# Patient Record
Sex: Female | Born: 1970 | Race: Black or African American | Hispanic: No | Marital: Married | Smoking: Never smoker
Health system: Southern US, Community
[De-identification: ages and names within clinical notes are randomized; demographics above are authoritative.]

## PROBLEM LIST (undated history)

## (undated) DIAGNOSIS — L709 Acne, unspecified: Secondary | ICD-10-CM

## (undated) DIAGNOSIS — IMO0002 Reserved for concepts with insufficient information to code with codable children: Secondary | ICD-10-CM

## (undated) DIAGNOSIS — F419 Anxiety disorder, unspecified: Secondary | ICD-10-CM

## (undated) DIAGNOSIS — J45909 Unspecified asthma, uncomplicated: Secondary | ICD-10-CM

## (undated) DIAGNOSIS — K449 Diaphragmatic hernia without obstruction or gangrene: Secondary | ICD-10-CM

## (undated) HISTORY — DX: Unspecified asthma, uncomplicated: J45.909

## (undated) HISTORY — DX: Anxiety disorder, unspecified: F41.9

## (undated) HISTORY — DX: Reserved for concepts with insufficient information to code with codable children: IMO0002

## (undated) HISTORY — PX: UMBILICAL HERNIA REPAIR: SHX196

## (undated) HISTORY — DX: Acne, unspecified: L70.9

## (undated) HISTORY — PX: WISDOM TOOTH EXTRACTION: SHX21

## (undated) HISTORY — PX: CHOLECYSTECTOMY: SHX55

## (undated) HISTORY — DX: Diaphragmatic hernia without obstruction or gangrene: K44.9

---

## 2013-02-20 ENCOUNTER — Ambulatory Visit (INDEPENDENT_AMBULATORY_CARE_PROVIDER_SITE_OTHER): Payer: Managed Care, Other (non HMO) | Admitting: Obstetrics & Gynecology

## 2013-02-20 ENCOUNTER — Encounter: Payer: Self-pay | Admitting: Obstetrics & Gynecology

## 2013-02-20 VITALS — BP 135/91 | HR 80 | Resp 16 | Ht 66.0 in | Wt 188.0 lb

## 2013-02-20 DIAGNOSIS — Z124 Encounter for screening for malignant neoplasm of cervix: Secondary | ICD-10-CM

## 2013-02-20 DIAGNOSIS — Z113 Encounter for screening for infections with a predominantly sexual mode of transmission: Secondary | ICD-10-CM

## 2013-02-20 DIAGNOSIS — Z23 Encounter for immunization: Secondary | ICD-10-CM

## 2013-02-20 DIAGNOSIS — Z1151 Encounter for screening for human papillomavirus (HPV): Secondary | ICD-10-CM

## 2013-02-20 DIAGNOSIS — Z01419 Encounter for gynecological examination (general) (routine) without abnormal findings: Secondary | ICD-10-CM

## 2013-02-20 MED ORDER — INFLUENZA VAC SPLIT QUAD 0.5 ML IM SUSP: 0.5000 mL | Freq: Once | INTRAMUSCULAR | Status: AC

## 2013-02-20 NOTE — Patient Instructions (Signed)
Preventive Care for Adults, Female A healthy lifestyle and preventive care can promote health and wellness. Preventive health guidelines for women include the following key practices.  A routine yearly physical is a good way to check with your health care provider about your health and preventive screening. It is a chance to share any concerns and updates on your health and to receive a thorough exam.  Visit your dentist for a routine exam and preventive care every 6 months. Brush your teeth twice a day and floss once a day. Good oral hygiene prevents tooth decay and gum disease.  The frequency of eye exams is based on your age, health, family medical history, use of contact lenses, and other factors. Follow your health care provider's recommendations for frequency of eye exams.  Eat a healthy diet. Foods like vegetables, fruits, whole grains, low-fat dairy products, and lean protein foods contain the nutrients you need without too many calories. Decrease your intake of foods high in solid fats, added sugars, and salt. Eat the right amount of calories for you.Get information about a proper diet from your health care provider, if necessary.  Regular physical exercise is one of the most important things you can do for your health. Most adults should get at least 150 minutes of moderate-intensity exercise (any activity that increases your heart rate and causes you to sweat) each week. In addition, most adults need muscle-strengthening exercises on 2 or more days a week.  Maintain a healthy weight. The body mass index (BMI) is a screening tool to identify possible weight problems. It provides an estimate of body fat based on height and weight. Your health care provider can find your BMI, and can help you achieve or maintain a healthy weight.For adults 20 years and older:  A BMI below 18.5 is considered underweight.  A BMI of 18.5 to 24.9 is normal.  A BMI of 25 to 29.9 is considered  overweight.  A BMI of 30 and above is considered obese.  Maintain normal blood lipids and cholesterol levels by exercising and minimizing your intake of saturated fat. Eat a balanced diet with plenty of fruit and vegetables. Blood tests for lipids and cholesterol should begin at age 20 and be repeated every 5 years. If your lipid or cholesterol levels are high, you are over 50, or you are at high risk for heart disease, you may need your cholesterol levels checked more frequently.Ongoing high lipid and cholesterol levels should be treated with medicines if diet and exercise are not working.  If you smoke, find out from your health care provider how to quit. If you do not use tobacco, do not start.  Lung cancer screening is recommended for adults aged 55 80 years who are at high risk for developing lung cancer because of a history of smoking. A yearly low-dose CT scan of the lungs is recommended for people who have at least a 30-pack-year history of smoking and are a current smoker or have quit within the past 15 years. A pack year of smoking is smoking an average of 1 pack of cigarettes a day for 1 year (for example: 1 pack a day for 30 years or 2 packs a day for 15 years). Yearly screening should continue until the smoker has stopped smoking for at least 15 years. Yearly screening should be stopped for people who develop a health problem that would prevent them from having lung cancer treatment.  If you are pregnant, do not drink alcohol. If you   are breastfeeding, be very cautious about drinking alcohol. If you are not pregnant and choose to drink alcohol, do not have more than 1 drink per day. One drink is considered to be 12 ounces (355 mL) of beer, 5 ounces (148 mL) of wine, or 1.5 ounces (44 mL) of liquor.  Avoid use of street drugs. Do not share needles with anyone. Ask for help if you need support or instructions about stopping the use of drugs.  High blood pressure causes heart disease and  increases the risk of stroke. Your blood pressure should be checked at least every 1 to 2 years. Ongoing high blood pressure should be treated with medicines if weight loss and exercise do not work.  If you are 20 43 years old, ask your health care provider if you should take aspirin to prevent strokes.  Diabetes screening involves taking a blood sample to check your fasting blood sugar level. This should be done once every 3 years, after age 35, if you are within normal weight and without risk factors for diabetes. Testing should be considered at a younger age or be carried out more frequently if you are overweight and have at least 1 risk factor for diabetes.  Breast cancer screening is essential preventive care for women. You should practice "breast self-awareness." This means understanding the normal appearance and feel of your breasts and may include breast self-examination. Any changes detected, no matter how small, should be reported to a health care provider. Women in their 42s and 30s should have a clinical breast exam (CBE) by a health care provider as part of a regular health exam every 1 to 3 years. After age 74, women should have a CBE every year. Starting at age 43, women should consider having a mammogram (breast X-ray test) every year. Women who have a family history of breast cancer should talk to their health care provider about genetic screening. Women at a high risk of breast cancer should talk to their health care providers about having an MRI and a mammogram every year.  Breast cancer gene (BRCA)-related cancer risk assessment is recommended for women who have family members with BRCA-related cancers. BRCA-related cancers include breast, ovarian, tubal, and peritoneal cancers. Having family members with these cancers may be associated with an increased risk for harmful changes (mutations) in the breast cancer genes BRCA1 and BRCA2. Results of the assessment will determine the need for  genetic counseling and BRCA1 and BRCA2 testing.  The Pap test is a screening test for cervical cancer. A Pap test can show cell changes on the cervix that might become cervical cancer if left untreated. A Pap test is a procedure in which cells are obtained and examined from the lower end of the uterus (cervix).  Women should have a Pap test starting at age 60.  Between ages 63 and 62, Pap tests should be repeated every 2 years.  Beginning at age 43, you should have a Pap test every 3 years as long as the past 3 Pap tests have been normal.  Some women have medical problems that increase the chance of getting cervical cancer. Talk to your health care provider about these problems. It is especially important to talk to your health care provider if a new problem develops soon after your last Pap test. In these cases, your health care provider may recommend more frequent screening and Pap tests.  The above recommendations are the same for women who have or have not gotten the vaccine  for human papillomavirus (HPV).  If you had a hysterectomy for a problem that was not cancer or a condition that could lead to cancer, then you no longer need Pap tests. Even if you no longer need a Pap test, a regular exam is a good idea to make sure no other problems are starting.  If you are between ages 65 and 70 years, and you have had normal Pap tests going back 10 years, you no longer need Pap tests. Even if you no longer need a Pap test, a regular exam is a good idea to make sure no other problems are starting.  If you have had past treatment for cervical cancer or a condition that could lead to cancer, you need Pap tests and screening for cancer for at least 20 years after your treatment.  If Pap tests have been discontinued, risk factors (such as a new sexual partner) need to be reassessed to determine if screening should be resumed.  The HPV test is an additional test that may be used for cervical cancer  screening. The HPV test looks for the virus that can cause the cell changes on the cervix. The cells collected during the Pap test can be tested for HPV. The HPV test could be used to screen women aged 30 years and older, and should be used in women of any age who have unclear Pap test results. After the age of 30, women should have HPV testing at the same frequency as a Pap test.  Colorectal cancer can be detected and often prevented. Most routine colorectal cancer screening begins at the age of 50 years and continues through age 75 years. However, your health care provider may recommend screening at an earlier age if you have risk factors for colon cancer. On a yearly basis, your health care provider may provide home test kits to check for hidden blood in the stool. Use of a small camera at the end of a tube, to directly examine the colon (sigmoidoscopy or colonoscopy), can detect the earliest forms of colorectal cancer. Talk to your health care provider about this at age 50, when routine screening begins. Direct exam of the colon should be repeated every 5 10 years through age 75 years, unless early forms of pre-cancerous polyps or small growths are found.  People who are at an increased risk for hepatitis B should be screened for this virus. You are considered at high risk for hepatitis B if:  You were born in a country where hepatitis B occurs often. Talk with your health care provider about which countries are considered high risk.  Your parents were born in a high-risk country and you have not received a shot to protect against hepatitis B (hepatitis B vaccine).  You have HIV or AIDS.  You use needles to inject street drugs.  You live with, or have sex with, someone who has Hepatitis B.  You get hemodialysis treatment.  You take certain medicines for conditions like cancer, organ transplantation, and autoimmune conditions.  Hepatitis C blood testing is recommended for all people born from  1945 through 1965 and any individual with known risks for hepatitis C.  Practice safe sex. Use condoms and avoid high-risk sexual practices to reduce the spread of sexually transmitted infections (STIs). STIs include gonorrhea, chlamydia, syphilis, trichomonas, herpes, HPV, and human immunodeficiency virus (HIV). Herpes, HIV, and HPV are viral illnesses that have no cure. They can result in disability, cancer, and death. Sexually active women aged 25   years and younger should be checked for chlamydia. Older women with new or multiple partners should also be tested for chlamydia. Testing for other STIs is recommended if you are sexually active and at increased risk.  Osteoporosis is a disease in which the bones lose minerals and strength with aging. This can result in serious bone fractures or breaks. The risk of osteoporosis can be identified using a bone density scan. Women ages 65 years and over and women at risk for fractures or osteoporosis should discuss screening with their health care providers. Ask your health care provider whether you should take a calcium supplement or vitamin D to reduce the rate of osteoporosis.  Menopause can be associated with physical symptoms and risks. Hormone replacement therapy is available to decrease symptoms and risks. You should talk to your health care provider about whether hormone replacement therapy is right for you.  Use sunscreen. Apply sunscreen liberally and repeatedly throughout the day. You should seek shade when your shadow is shorter than you. Protect yourself by wearing long sleeves, pants, a wide-brimmed hat, and sunglasses year round, whenever you are outdoors.  Once a month, do a whole body skin exam, using a mirror to look at the skin on your back. Tell your health care provider of new moles, moles that have irregular borders, moles that are larger than a pencil eraser, or moles that have changed in shape or color.  Stay current with required  vaccines (immunizations).  Influenza vaccine. All adults should be immunized every year.  Tetanus, diphtheria, and acellular pertussis (Td, Tdap) vaccine. Pregnant women should receive 1 dose of Tdap vaccine during each pregnancy. The dose should be obtained regardless of the length of time since the last dose. Immunization is preferred during the 27th 36th week of gestation. An adult who has not previously received Tdap or who does not know her vaccine status should receive 1 dose of Tdap. This initial dose should be followed by tetanus and diphtheria toxoids (Td) booster doses every 10 years. Adults with an unknown or incomplete history of completing a 3-dose immunization series with Td-containing vaccines should begin or complete a primary immunization series including a Tdap dose. Adults should receive a Td booster every 10 years.  Varicella vaccine. An adult without evidence of immunity to varicella should receive 2 doses or a second dose if she has previously received 1 dose. Pregnant females who do not have evidence of immunity should receive the first dose after pregnancy. This first dose should be obtained before leaving the health care facility. The second dose should be obtained 4 8 weeks after the first dose.  Human papillomavirus (HPV) vaccine. Females aged 13 26 years who have not received the vaccine previously should obtain the 3-dose series. The vaccine is not recommended for use in pregnant females. However, pregnancy testing is not needed before receiving a dose. If a female is found to be pregnant after receiving a dose, no treatment is needed. In that case, the remaining doses should be delayed until after the pregnancy. Immunization is recommended for any person with an immunocompromised condition through the age of 26 years if she did not get any or all doses earlier. During the 3-dose series, the second dose should be obtained 4 8 weeks after the first dose. The third dose should be  obtained 24 weeks after the first dose and 16 weeks after the second dose.  Zoster vaccine. One dose is recommended for adults aged 60 years or older unless certain   conditions are present.  Measles, mumps, and rubella (MMR) vaccine. Adults born before 1957 generally are considered immune to measles and mumps. Adults born in 1957 or later should have 1 or more doses of MMR vaccine unless there is a contraindication to the vaccine or there is laboratory evidence of immunity to each of the three diseases. A routine second dose of MMR vaccine should be obtained at least 28 days after the first dose for students attending postsecondary schools, health care workers, or international travelers. People who received inactivated measles vaccine or an unknown type of measles vaccine during 1963 1967 should receive 2 doses of MMR vaccine. People who received inactivated mumps vaccine or an unknown type of mumps vaccine before 1979 and are at high risk for mumps infection should consider immunization with 2 doses of MMR vaccine. For females of childbearing age, rubella immunity should be determined. If there is no evidence of immunity, females who are not pregnant should be vaccinated. If there is no evidence of immunity, females who are pregnant should delay immunization until after pregnancy. Unvaccinated health care workers born before 1957 who lack laboratory evidence of measles, mumps, or rubella immunity or laboratory confirmation of disease should consider measles and mumps immunization with 2 doses of MMR vaccine or rubella immunization with 1 dose of MMR vaccine.  Pneumococcal 13-valent conjugate (PCV13) vaccine. When indicated, a person who is uncertain of her immunization history and has no record of immunization should receive the PCV13 vaccine. An adult aged 19 years or older who has certain medical conditions and has not been previously immunized should receive 1 dose of PCV13 vaccine. This PCV13 should be  followed with a dose of pneumococcal polysaccharide (PPSV23) vaccine. The PPSV23 vaccine dose should be obtained at least 8 weeks after the dose of PCV13 vaccine. An adult aged 19 years or older who has certain medical conditions and previously received 1 or more doses of PPSV23 vaccine should receive 1 dose of PCV13. The PCV13 vaccine dose should be obtained 1 or more years after the last PPSV23 vaccine dose.  Pneumococcal polysaccharide (PPSV23) vaccine. When PCV13 is also indicated, PCV13 should be obtained first. All adults aged 65 years and older should be immunized. An adult younger than age 65 years who has certain medical conditions should be immunized. Any person who resides in a nursing home or long-term care facility should be immunized. An adult smoker should be immunized. People with an immunocompromised condition and certain other conditions should receive both PCV13 and PPSV23 vaccines. People with human immunodeficiency virus (HIV) infection should be immunized as soon as possible after diagnosis. Immunization during chemotherapy or radiation therapy should be avoided. Routine use of PPSV23 vaccine is not recommended for American Indians, [place] Natives, or people younger than 65 years unless there are medical conditions that require PPSV23 vaccine. When indicated, people who have unknown immunization and have no record of immunization should receive PPSV23 vaccine. One-time revaccination 5 years after the first dose of PPSV23 is recommended for people aged 19 64 years who have chronic kidney failure, nephrotic syndrome, asplenia, or immunocompromised conditions. People who received 1 2 doses of PPSV23 before age 65 years should receive another dose of PPSV23 vaccine at age 65 years or later if at least 5 years have passed since the previous dose. Doses of PPSV23 are not needed for people immunized with PPSV23 at or after age 65 years.  Meningococcal vaccine. Adults with asplenia or persistent  complement component deficiencies should receive 2   doses of quadrivalent meningococcal conjugate (MenACWY-D) vaccine. The doses should be obtained at least 2 months apart. Microbiologists working with certain meningococcal bacteria, military recruits, people at risk during an outbreak, and people who travel to or live in countries with a high rate of meningitis should be immunized. A first-year college student up through age 21 years who is living in a residence hall should receive a dose if she did not receive a dose on or after her 16th birthday. Adults who have certain high-risk conditions should receive one or more doses of vaccine.  Hepatitis A vaccine. Adults who wish to be protected from this disease, have certain high-risk conditions, work with hepatitis A-infected animals, work in hepatitis A research labs, or travel to or work in countries with a high rate of hepatitis A should be immunized. Adults who were previously unvaccinated and who anticipate close contact with an international adoptee during the first 60 days after arrival in the [place] from a country with a high rate of hepatitis A should be immunized.  Hepatitis B vaccine. Adults who wish to be protected from this disease, have certain high-risk conditions, may be exposed to blood or other infectious body fluids, are household contacts or sex partners of hepatitis B positive people, are clients or workers in certain care facilities, or travel to or work in countries with a high rate of hepatitis B should be immunized.  Haemophilus influenzae type b (Hib) vaccine. A previously unvaccinated person with asplenia or sickle cell disease or having a scheduled splenectomy should receive 1 dose of Hib vaccine. Regardless of previous immunization, a recipient of a hematopoietic stem cell transplant should receive a 3-dose series 6 12 months after her successful transplant. Hib vaccine is not recommended for adults with HIV  infection. Preventive Services / Frequency Ages 19 to 39years  Blood pressure check.** / Every 1 to 2 years.  Lipid and cholesterol check.** / Every 5 years beginning at age 20.  Clinical breast exam.** / Every 3 years for women in their 20s and 30s.  BRCA-related cancer risk assessment.** / For women who have family members with a BRCA-related cancer (breast, ovarian, tubal, or peritoneal cancers).  Pap test.** / Every 2 years from ages 21 through 29. Every 3 years starting at age 30 through age 65 or 70 with a history of 3 consecutive normal Pap tests.  HPV screening.** / Every 3 years from ages 30 through ages 65 to 70 with a history of 3 consecutive normal Pap tests.  Hepatitis C blood test.** / For any individual with known risks for hepatitis C.  Skin self-exam. / Monthly.  Influenza vaccine. / Every year.  Tetanus, diphtheria, and acellular pertussis (Tdap, Td) vaccine.** / Consult your health care provider. Pregnant women should receive 1 dose of Tdap vaccine during each pregnancy. 1 dose of Td every 10 years.  Varicella vaccine.** / Consult your health care provider. Pregnant females who do not have evidence of immunity should receive the first dose after pregnancy.  HPV vaccine. / 3 doses over 6 months, if 26 and younger. The vaccine is not recommended for use in pregnant females. However, pregnancy testing is not needed before receiving a dose.  Measles, mumps, rubella (MMR) vaccine.** / You need at least 1 dose of MMR if you were born in 1957 or later. You may also need a 2nd dose. For females of childbearing age, rubella immunity should be determined. If there is no evidence of immunity, females who are not   pregnant should be vaccinated. If there is no evidence of immunity, females who are pregnant should delay immunization until after pregnancy.  Pneumococcal 13-valent conjugate (PCV13) vaccine.** / Consult your health care provider.  Pneumococcal polysaccharide (PPSV23)  vaccine.** / 1 to 2 doses if you smoke cigarettes or if you have certain conditions.  Meningococcal vaccine.** / 1 dose if you are age 88 to 41 years and a Market researcher living in a residence hall, or have one of several medical conditions, you need to get vaccinated against meningococcal disease. You may also need additional booster doses.  Hepatitis A vaccine.** / Consult your health care provider.  Hepatitis B vaccine.** / Consult your health care provider.  Haemophilus influenzae type b (Hib) vaccine.** / Consult your health care provider. Ages 45 to 64years  Blood pressure check.** / Every 1 to 2 years.  Lipid and cholesterol check.** / Every 5 years beginning at age 2 years.  Lung cancer screening. / Every year if you are aged 10 80 years and have a 30-pack-year history of smoking and currently smoke or have quit within the past 15 years. Yearly screening is stopped once you have quit smoking for at least 15 years or develop a health problem that would prevent you from having lung cancer treatment.  Clinical breast exam.** / Every year after age 28 years.  BRCA-related cancer risk assessment.** / For women who have family members with a BRCA-related cancer (breast, ovarian, tubal, or peritoneal cancers).  Mammogram.** / Every year beginning at age 63 years and continuing for as long as you are in good health. Consult with your health care provider.  Pap test.** / Every 3 years starting at age 71 years through age 39 or 90 years with a history of 3 consecutive normal Pap tests.  HPV screening.** / Every 3 years from ages 27 years through ages 32 to 72 years with a history of 3 consecutive normal Pap tests.  Fecal occult blood test (FOBT) of stool. / Every year beginning at age 40 years and continuing until age 29 years. You may not need to do this test if you get a colonoscopy every 10 years.  Flexible sigmoidoscopy or colonoscopy.** / Every 5 years for a flexible  sigmoidoscopy or every 10 years for a colonoscopy beginning at age 66 years and continuing until age 47 years.  Hepatitis C blood test.** / For all people born from [birth year] through 1965 and any individual with known risks for hepatitis C.  Skin self-exam. / Monthly.  Influenza vaccine. / Every year.  Tetanus, diphtheria, and acellular pertussis (Tdap/Td) vaccine.** / Consult your health care provider. Pregnant women should receive 1 dose of Tdap vaccine during each pregnancy. 1 dose of Td every 10 years.  Varicella vaccine.** / Consult your health care provider. Pregnant females who do not have evidence of immunity should receive the first dose after pregnancy.  Zoster vaccine.** / 1 dose for adults aged 39 years or older.  Measles, mumps, rubella (MMR) vaccine.** / You need at least 1 dose of MMR if you were born in 1957 or later. You may also need a 2nd dose. For females of childbearing age, rubella immunity should be determined. If there is no evidence of immunity, females who are not pregnant should be vaccinated. If there is no evidence of immunity, females who are pregnant should delay immunization until after pregnancy.  Pneumococcal 13-valent conjugate (PCV13) vaccine.** / Consult your health care provider.  Pneumococcal polysaccharide (PPSV23) vaccine.** / 1  to 2 doses if you smoke cigarettes or if you have certain conditions.  Meningococcal vaccine.** / Consult your health care provider.  Hepatitis A vaccine.** / Consult your health care provider.  Hepatitis B vaccine.** / Consult your health care provider.  Haemophilus influenzae type b (Hib) vaccine.** / Consult your health care provider. Ages 66 years and over  Blood pressure check.** / Every 1 to 2 years.  Lipid and cholesterol check.** / Every 5 years beginning at age 50 years.  Lung cancer screening. / Every year if you are aged 65 80 years and have a 30-pack-year history of smoking and currently smoke or have quit  within the past 15 years. Yearly screening is stopped once you have quit smoking for at least 15 years or develop a health problem that would prevent you from having lung cancer treatment.  Clinical breast exam.** / Every year after age 71 years.  BRCA-related cancer risk assessment.** / For women who have family members with a BRCA-related cancer (breast, ovarian, tubal, or peritoneal cancers).  Mammogram.** / Every year beginning at age 58 years and continuing for as long as you are in good health. Consult with your health care provider.  Pap test.** / Every 3 years starting at age 51 years through age 34 or 38 years with 3 consecutive normal Pap tests. Testing can be stopped between 65 and 70 years with 3 consecutive normal Pap tests and no abnormal Pap or HPV tests in the past 10 years.  HPV screening.** / Every 3 years from ages 35 years through ages 84 or 80 years with a history of 3 consecutive normal Pap tests. Testing can be stopped between 65 and 70 years with 3 consecutive normal Pap tests and no abnormal Pap or HPV tests in the past 10 years.  Fecal occult blood test (FOBT) of stool. / Every year beginning at age 26 years and continuing until age 51 years. You may not need to do this test if you get a colonoscopy every 10 years.  Flexible sigmoidoscopy or colonoscopy.** / Every 5 years for a flexible sigmoidoscopy or every 10 years for a colonoscopy beginning at age 55 years and continuing until age 19 years.  Hepatitis C blood test.** / For all people born from [birth year] through 1965 and any individual with known risks for hepatitis C.  Osteoporosis screening.** / A one-time screening for women ages 68 years and over and women at risk for fractures or osteoporosis.  Skin self-exam. / Monthly.  Influenza vaccine. / Every year.  Tetanus, diphtheria, and acellular pertussis (Tdap/Td) vaccine.** / 1 dose of Td every 10 years.  Varicella vaccine.** / Consult your health care  provider.  Zoster vaccine.** / 1 dose for adults aged 21 years or older.  Pneumococcal 13-valent conjugate (PCV13) vaccine.** / Consult your health care provider.  Pneumococcal polysaccharide (PPSV23) vaccine.** / 1 dose for all adults aged 43 years and older.  Meningococcal vaccine.** / Consult your health care provider.  Hepatitis A vaccine.** / Consult your health care provider.  Hepatitis B vaccine.** / Consult your health care provider.  Haemophilus influenzae type b (Hib) vaccine.** / Consult your health care provider. ** Family history and personal history of risk and conditions may change your health care provider's recommendations. Document Released: 03/22/2001 Document Revised: 11/14/2012 Document Reviewed: 06/21/2010 [facility] Patient Information 2014 [place], [place].  Thank you for enrolling in [place]. Please follow the instructions below to securely access your online medical record. MyChart allows you to send messages  to your doctor, view your test results, manage appointments, and more.   How Do I Sign Up? 1. In your Internet browser, go to [employer] and enter [email or web address]. 2. Click on the Sign Up Now link in the Sign In box. You will see the New Member Sign Up page. 3. Enter your MyChart Access Code exactly as it appears below. You will not need to use this code after you've completed the sign-up process. If you do not sign up before the expiration date, you must request a new code.  MyChart Access Code: [email or web address] Expires: 04/21/2013  9:57 AM  4. Enter your Social Security Number ([id number]) and Date of Birth (mm/dd/yyyy) as indicated and click Submit. You will be taken to the next sign-up page. 5. Create a MyChart ID. This will be your MyChart login ID and cannot be changed, so think of one that is secure and easy to remember. 6. Create a MyChart password. You can change your password at any time. 7. Enter your Password Reset Question  and Answer. This can be used at a later time if you forget your password.  8. Enter your e-mail address. You will receive e-mail notification when new information is available in [place]. 9. Click Sign Up. You can now view your medical record.   Additional Information Remember, MyChart is NOT to be used for urgent needs. For medical emergencies, dial 911.

## 2013-02-20 NOTE — Progress Notes (Signed)
    Subjective:     Jaclyn Joseph is a 43 y.o. G73P1011 female and is here for a comprehensive gynecologic exam. The patient reports having recurrent BV episodes, no current symptoms.  Wants STI screen. No other acute concerns.  Needs mammogram scheduled.   History   Social History  . Marital Status: Married    Spouse Name: N/A    Number of Children: N/A  . Years of Education: N/A   Occupational History  . Nurse    Social History Main Topics  . Smoking status: Never Smoker   . Smokeless tobacco: Never Used  . Alcohol Use: Yes     Comment: very rarely  . Drug Use: No  . Sexual Activity: Yes    Partners: Male   Other Topics Concern  . Not on file   Social History Narrative  . No narrative on file   No health maintenance topics applied.  The following portions of the patient's history were reviewed and updated as appropriate: allergies, current medications, past family history, past medical history, past social history, past surgical history and problem list.  Review of Systems A comprehensive review of systems was negative.   Objective:     BP 135/91  Pulse 80  Resp 16  Ht 5\' 6"  (1.676 m)  Wt 188 lb (85.276 kg)  BMI 30.36 kg/m2  LMP 01/29/2013 GENERAL: Well-developed, well-nourished female in no acute distress.  HEENT: Normocephalic, atraumatic. Sclerae anicteric.  NECK: Supple. Normal thyroid.  LUNGS: Clear to auscultation bilaterally.  HEART: Regular rate and rhythm. BREASTS: Symmetric in size. No masses, skin changes, nipple drainage, or lymphadenopathy. ABDOMEN: Soft, nontender, nondistended. No organomegaly. PELVIC: Normal external female genitalia. Vagina is pink and rugated.  Scant white discharge. Normal cervix contour. Pap smear obtained. Uterus is retroverted, normal in size. No adnexal mass or tenderness.  EXTREMITIES: No cyanosis, clubbing, or edema, 2+ distal pulses.   Assessment:    Healthy female exam Desires STI screen/ancillary testing     Plan:   Pap done with ancillary testing, will follow up results and manage accordingly. If there is BV, will consider extended treatment  Mammogram scheduled STI screen done; will follow up results and manage accordingly. Routine preventative health maintenance measures emphasized   Verita Schneiders, MD, [place] Attending [place], [place]

## 2013-02-21 LAB — SYPHILIS: RPR W/REFLEX TO RPR TITER AND TREPONEMAL ANTIBODIES, TRADITIONAL SCREENING AND DIAGNOSIS ALGORITHM

## 2013-02-21 LAB — HEPATITIS C ANTIBODY: HCV Ab: NEGATIVE

## 2013-02-21 LAB — HIV ANTIBODY (ROUTINE TESTING W REFLEX): HIV: NONREACTIVE

## 2013-02-21 LAB — HSV(HERPES SMPLX)ABS-I+II(IGG+IGM)-BLD
HSV 1 Glycoprotein G Ab, IgG: 0.11 IV
HSV 2 Glycoprotein G Ab, IgG: 0.16 IV
Herpes Simplex Vrs I&II-IgM Ab (EIA): 1.05 INDEX

## 2013-02-21 LAB — HEPATITIS B SURFACE ANTIGEN: Hepatitis B Surface Ag: NEGATIVE

## 2013-02-26 ENCOUNTER — Ambulatory Visit (INDEPENDENT_AMBULATORY_CARE_PROVIDER_SITE_OTHER): Payer: Managed Care, Other (non HMO)

## 2013-02-26 DIAGNOSIS — Z01419 Encounter for gynecological examination (general) (routine) without abnormal findings: Secondary | ICD-10-CM

## 2013-02-26 DIAGNOSIS — Z1231 Encounter for screening mammogram for malignant neoplasm of breast: Secondary | ICD-10-CM

## 2013-02-28 NOTE — Progress Notes (Signed)
Notified patient of results 

## 2013-12-09 ENCOUNTER — Encounter: Payer: Self-pay | Admitting: Obstetrics & Gynecology

## 2014-04-23 ENCOUNTER — Other Ambulatory Visit: Payer: Self-pay | Admitting: Obstetrics & Gynecology

## 2014-04-23 DIAGNOSIS — Z139 Encounter for screening, unspecified: Secondary | ICD-10-CM

## 2014-04-24 ENCOUNTER — Ambulatory Visit (INDEPENDENT_AMBULATORY_CARE_PROVIDER_SITE_OTHER): Payer: Managed Care, Other (non HMO) | Admitting: Obstetrics & Gynecology

## 2014-04-24 ENCOUNTER — Encounter: Payer: Self-pay | Admitting: Obstetrics & Gynecology

## 2014-04-24 VITALS — BP 124/81 | HR 86 | Resp 16 | Ht 66.0 in | Wt 196.0 lb

## 2014-04-24 DIAGNOSIS — Z23 Encounter for immunization: Secondary | ICD-10-CM

## 2014-04-24 DIAGNOSIS — Z01419 Encounter for gynecological examination (general) (routine) without abnormal findings: Secondary | ICD-10-CM

## 2014-04-24 DIAGNOSIS — Z124 Encounter for screening for malignant neoplasm of cervix: Secondary | ICD-10-CM

## 2014-04-24 DIAGNOSIS — Z0000 Encounter for general adult medical examination without abnormal findings: Secondary | ICD-10-CM

## 2014-04-24 DIAGNOSIS — N760 Acute vaginitis: Secondary | ICD-10-CM

## 2014-04-24 DIAGNOSIS — Z1151 Encounter for screening for human papillomavirus (HPV): Secondary | ICD-10-CM | POA: Diagnosis not present

## 2014-04-24 NOTE — Progress Notes (Signed)
Subjective:    Jaclyn Joseph is a 44 y.o. M AA P49 (57 yo son) female who presents for an annual exam. The patient has no complaints today except recurrent BV.  The patient is sexually active. GYN screening history: last pap: was normal. The patient wears seatbelts: yes. The patient participates in regular exercise: yes. Has the patient ever been transfused or tattooed?: no. The patient reports that there is not domestic violence in her life.   Menstrual History: OB History    Gravida Para Term Preterm AB TAB SAB Ectopic Multiple Living   2 1 1  1  1   1       Menarche age: 18  Patient's last menstrual period was 03/03/2014 (approximate).    The following portions of the patient's history were reviewed and updated as appropriate: allergies, current medications, past family history, past medical history, past social history, past surgical history and problem list.  Review of Systems Pertinent items are noted in HPI. Married for 10 years, some dryness with sex. Periods are only about once per year, lots of hot flashes. She was told that her [facility] showed her to be in menopause in the past.   Objective:    BP 124/81 mmHg  Pulse 86  Resp 16  Ht 5\' 6"  (1.676 m)  Wt 196 lb (88.905 kg)  BMI 31.65 kg/m2  LMP 03/03/2014 (Approximate)  General Appearance:    Alert, cooperative, no distress, appears stated age  Head:    Normocephalic, without obvious abnormality, atraumatic  Eyes:    PERRL, conjunctiva/corneas clear, EOM's intact, fundi    benign, both eyes  Ears:    Normal TM's and external ear canals, both ears  Nose:   Nares normal, septum midline, mucosa normal, no drainage    or sinus tenderness  Throat:   Lips, mucosa, and tongue normal; teeth and gums normal  Neck:   Supple, symmetrical, trachea midline, no adenopathy;    thyroid:  no enlargement/tenderness/nodules; no carotid   bruit or JVD  Back:     Symmetric, no curvature, ROM normal, no CVA tenderness  Lungs:     Clear to  auscultation bilaterally, respirations unlabored  Chest Wall:    No tenderness or deformity   Heart:    Regular rate and rhythm, S1 and S2 normal, no murmur, rub   or gallop  Breast Exam:    No tenderness, masses, or nipple abnormality  Abdomen:     Soft, non-tender, bowel sounds active all four quadrants,    no masses, no organomegaly  Genitalia:    Normal female without lesion, discharge or tenderness, moderate atrophy, NSSmidplane, NT, mobile, normal adnexal exam     Extremities:   Extremities normal, atraumatic, no cyanosis or edema  Pulses:   2+ and symmetric all extremities  Skin:   Skin color, texture, turgor normal, no rashes or lesions  Lymph nodes:   Cervical, supraclavicular, and axillary nodes normal  Neurologic:   CNII-XII intact, normal strength, sensation and reflexes    throughout  .    Assessment:    Healthy female exam.   PMB Recurrent BV   Plan:     Breast self exam technique reviewed and patient encouraged to perform self-exam monthly. Mammogram. Thin prep Pap smear. with cotesing Treat with boric acid Rec continue yogourt/sour krout

## 2014-04-28 LAB — CYTOLOGY - PAP

## 2014-05-01 ENCOUNTER — Ambulatory Visit (INDEPENDENT_AMBULATORY_CARE_PROVIDER_SITE_OTHER): Payer: Managed Care, Other (non HMO)

## 2014-05-01 DIAGNOSIS — Z1231 Encounter for screening mammogram for malignant neoplasm of breast: Secondary | ICD-10-CM

## 2014-05-01 DIAGNOSIS — Z0000 Encounter for general adult medical examination without abnormal findings: Secondary | ICD-10-CM

## 2014-05-01 DIAGNOSIS — Z139 Encounter for screening, unspecified: Secondary | ICD-10-CM

## 2014-05-01 DIAGNOSIS — D252 Subserosal leiomyoma of uterus: Secondary | ICD-10-CM | POA: Diagnosis not present

## 2016-10-06 ENCOUNTER — Ambulatory Visit (INDEPENDENT_AMBULATORY_CARE_PROVIDER_SITE_OTHER): Payer: Managed Care, Other (non HMO) | Admitting: Obstetrics & Gynecology

## 2016-10-06 ENCOUNTER — Encounter: Payer: Self-pay | Admitting: Obstetrics & Gynecology

## 2016-10-06 VITALS — BP 111/78 | HR 71 | Resp 16 | Ht 66.0 in | Wt 199.0 lb

## 2016-10-06 DIAGNOSIS — Z1151 Encounter for screening for human papillomavirus (HPV): Secondary | ICD-10-CM | POA: Diagnosis not present

## 2016-10-06 DIAGNOSIS — Z01419 Encounter for gynecological examination (general) (routine) without abnormal findings: Secondary | ICD-10-CM | POA: Diagnosis not present

## 2016-10-06 DIAGNOSIS — Z124 Encounter for screening for malignant neoplasm of cervix: Secondary | ICD-10-CM

## 2016-10-06 MED ORDER — METRONIDAZOLE 500 MG PO TABS: 500.0000 mg | ORAL_TABLET | Freq: Two times a day (BID) | ORAL | 6 refills | Status: DC

## 2016-10-06 NOTE — Progress Notes (Signed)
Subjective:    Jaclyn Joseph is a 46 y.o. M P1 (60 yo son) female who presents for an annual exam. The patient has no complaints today. She needs refills on BV. She reports that her vagina looks different than usual. Some pain with standing. She reports that her libido has gone.The patient is not currently sexually active (for the last year). GYN screening history: last pap: was normal. The patient wears seatbelts: yes. The patient participates in regular exercise: no. Has the patient ever been transfused or tattooed?: no. The patient reports that there is not domestic violence in her life.   Menstrual History: OB History    Gravida Para Term Preterm AB Living   2 1 1   1 1    SAB TAB Ectopic Multiple Live Births   1              Menarche age: 88 Patient's last menstrual period was 04/02/2014.    The following portions of the patient's history were reviewed and updated as appropriate: allergies, current medications, past family history, past medical history, past social history, past surgical history and problem list.  Review of Systems Pertinent items are noted in HPI.   FH- + breast- pat first cousin, no gyn/colon cancer + prostate cancer in m GF Works at [employer] care, RN Married for 12 years No periods for 2 years Gets her fasting labs with Dr. Bailey Mech   Objective:    BP 111/78   Pulse 71   Resp 16   Ht 5\' 6"  (1.676 m)   Wt 199 lb (90.3 kg)   LMP 04/02/2014   BMI 32.12 kg/m   General Appearance:    Alert, cooperative, no distress, appears stated age  Head:    Normocephalic, without obvious abnormality, atraumatic  Eyes:    PERRL, conjunctiva/corneas clear, EOM's intact, fundi    benign, both eyes  Ears:    Normal TM's and external ear canals, both ears  Nose:   Nares normal, septum midline, mucosa normal, no drainage    or sinus tenderness  Throat:   Lips, mucosa, and tongue normal; teeth and gums normal  Neck:   Supple, symmetrical, trachea midline, no  adenopathy;    thyroid:  no enlargement/tenderness/nodules; no carotid   bruit or JVD  Back:     Symmetric, no curvature, ROM normal, no CVA tenderness  Lungs:     Clear to auscultation bilaterally, respirations unlabored  Chest Wall:    No tenderness or deformity   Heart:    Regular rate and rhythm, S1 and S2 normal, no murmur, rub   or gallop  Breast Exam:    No tenderness, masses, or nipple abnormality  Abdomen:     Soft, non-tender, bowel sounds active all four quadrants,    no masses, no organomegaly  Genitalia:    Normal female without lesion, discharge or tenderness, NSSmidplane, mobile, NT, no adnexal masses appreciated   Her vulva looks entirely normal, no prolapse  Extremities:   Extremities normal, atraumatic, no cyanosis or edema  Pulses:   2+ and symmetric all extremities  Skin:   Skin color, texture, turgor normal, no rashes or lesions  Lymph nodes:   Cervical, supraclavicular, and axillary nodes normal  Neurologic:   CNII-XII intact, normal strength, sensation and reflexes    throughout  .    Assessment:    Healthy female exam.   No libido and absence of sex   Plan:     Thin prep Pap  smear. with cotesting Trial of test 2% 3 times per week Rec [facility] Flagyl with refills, continue boric acid prn, probiotics daily

## 2016-10-12 ENCOUNTER — Ambulatory Visit (INDEPENDENT_AMBULATORY_CARE_PROVIDER_SITE_OTHER): Payer: Managed Care, Other (non HMO)

## 2016-10-12 DIAGNOSIS — Z1231 Encounter for screening mammogram for malignant neoplasm of breast: Secondary | ICD-10-CM | POA: Diagnosis not present

## 2016-10-12 DIAGNOSIS — Z01419 Encounter for gynecological examination (general) (routine) without abnormal findings: Secondary | ICD-10-CM

## 2016-10-13 LAB — CYTOLOGY - PAP
Diagnosis: NEGATIVE
HPV: NOT DETECTED

## 2020-03-26 ENCOUNTER — Other Ambulatory Visit: Payer: Self-pay

## 2020-03-26 ENCOUNTER — Other Ambulatory Visit (HOSPITAL_COMMUNITY)
Admission: RE | Admit: 2020-03-26 | Discharge: 2020-03-26 | Disposition: A | Discharge status: Home / Self Care | Payer: Managed Care, Other (non HMO) | Source: Ambulatory Visit | Attending: Obstetrics and Gynecology | Admitting: Obstetrics and Gynecology

## 2020-03-26 ENCOUNTER — Encounter: Payer: Self-pay | Admitting: Obstetrics and Gynecology

## 2020-03-26 ENCOUNTER — Ambulatory Visit (INDEPENDENT_AMBULATORY_CARE_PROVIDER_SITE_OTHER): Payer: Managed Care, Other (non HMO) | Admitting: Obstetrics and Gynecology

## 2020-03-26 ENCOUNTER — Ambulatory Visit (INDEPENDENT_AMBULATORY_CARE_PROVIDER_SITE_OTHER): Payer: Managed Care, Other (non HMO)

## 2020-03-26 VITALS — BP 127/85 | HR 70 | Ht 66.0 in | Wt 203.0 lb

## 2020-03-26 DIAGNOSIS — Z1231 Encounter for screening mammogram for malignant neoplasm of breast: Secondary | ICD-10-CM

## 2020-03-26 DIAGNOSIS — N760 Acute vaginitis: Secondary | ICD-10-CM

## 2020-03-26 DIAGNOSIS — Z124 Encounter for screening for malignant neoplasm of cervix: Secondary | ICD-10-CM | POA: Diagnosis present

## 2020-03-26 DIAGNOSIS — R350 Frequency of micturition: Secondary | ICD-10-CM | POA: Diagnosis not present

## 2020-03-26 DIAGNOSIS — B9689 Other specified bacterial agents as the cause of diseases classified elsewhere: Secondary | ICD-10-CM

## 2020-03-26 DIAGNOSIS — Z01419 Encounter for gynecological examination (general) (routine) without abnormal findings: Secondary | ICD-10-CM

## 2020-03-26 DIAGNOSIS — N9089 Other specified noninflammatory disorders of vulva and perineum: Secondary | ICD-10-CM

## 2020-03-26 MED ORDER — METRONIDAZOLE 500 MG PO TABS
500.0000 mg | ORAL_TABLET | Freq: Two times a day (BID) | ORAL | 6 refills | Status: AC
Start: 2020-03-26 — End: ?

## 2020-03-26 NOTE — Progress Notes (Signed)
GYNECOLOGY ANNUAL PREVENTATIVE CARE ENCOUNTER NOTE  Subjective:   Jaclyn Joseph is a 50 y.o. G28P1011 female here for a annual gynecologic exam. Current complaints: labial lesion. Has had it for a while, burns occasionally, has not done anything for it.     Denies abnormal vaginal bleeding, discharge, pelvic pain, problems with intercourse or other gynecologic concerns. Declines STI screen as she had it done recently. No currently sexually active. No bleeding since menopause 2016.   Gynecologic History Patient's last menstrual period was 04/02/2014. Contraception: post menopausal status Last Pap: 2018. Results: normal Last mammogram: 2018. Results: normal DEXA: has never had  Obstetric History OB History  Gravida Para Term Preterm AB Living  2 1 1   1 1   SAB IAB Ectopic Multiple Live Births  1            # Outcome Date GA Lbr Len/2nd Weight Sex Delivery Anes PTL Lv  2 Term 09/26/91    Jerilynn Mages Vag-Spont EPI    1 SAB             Past Medical History:  Diagnosis Date  . Acne   . Anxiety   . Asthma   . Degenerative disc disease   . Hiatal hernia     Past Surgical History:  Procedure Laterality Date  . CHOLECYSTECTOMY    . UMBILICAL HERNIA REPAIR    . WISDOM TOOTH EXTRACTION      Current Outpatient Medications on File Prior to Visit  Medication Sig Dispense Refill  . acetaminophen (TYLENOL) 500 MG tablet Take by mouth.    Marland Kitchen albuterol (PROVENTIL HFA;VENTOLIN HFA) 108 (90 BASE) MCG/ACT inhaler Inhale into the lungs every 6 (six) hours as needed for wheezing or shortness of breath.    Marland Kitchen albuterol (PROVENTIL) (2.5 MG/3ML) 0.083% nebulizer solution TAKE 3 MLS (2.5 MG TOTAL) BY NEBULIZATION EVERY 6 (SIX) HOURS AS NEEDED FOR WHEEZING.    . ALPRAZolam (XANAX) 0.25 MG tablet Take by mouth.    Marland Kitchen ascorbic acid (VITAMIN C) 100 MG tablet Take by mouth.    . Cholecalciferol (VITAMIN D) 2000 UNITS CAPS Take by mouth.    . citalopram (CELEXA) 20 MG tablet take 1 tablet by mouth once  daily    . cyclobenzaprine (FLEXERIL) 5 MG tablet Take by mouth.    . desonide (DESOWEN) 0.05 % cream Apply to affected area 2 times daily AS NEEDED    . diphenhydrAMINE (SOMINEX) 25 MG tablet Take by mouth.    . fluticasone (FLONASE) 50 MCG/ACT nasal spray     . golimumab (SIMPONI ARIA) 50 MG/4ML SOLN injection Inject into the vein.    Marland Kitchen ibuprofen (ADVIL) 800 MG tablet Take 800 mg by mouth every 6 (six) hours as needed.    Marland Kitchen ibuprofen (ADVIL,MOTRIN) 200 MG tablet Take 200 mg by mouth every 6 (six) hours as needed.    Marland Kitchen ketoconazole (NIZORAL) 2 % cream Apply topically.    Marland Kitchen loratadine (CLARITIN) 10 MG tablet Take by mouth.    . miconazole (MICOTIN) 2 % powder Apply to affected area 2 times daily    . Multiple Vitamin (MULTIVITAMIN) tablet Take 1 tablet by mouth daily.    . Omega-3 Fatty Acids (FISH OIL) 1000 MG CAPS Take 1 capsule by mouth daily.    Marland Kitchen omeprazole (PRILOSEC) 40 MG capsule Take by mouth.    . penicillin v potassium (VEETID) 500 MG tablet Take 500 mg by mouth every 6 (six) hours.    . phentermine 37.5  MG capsule Take by mouth.    . predniSONE (DELTASONE) 10 MG tablet Take 10 mg by mouth 2 (two) times daily.    . promethazine (PHENERGAN) 25 MG tablet Take 1 tablet by mouth every 6 (six) hours as needed.    Marland Kitchen spironolactone (ALDACTONE) 50 MG tablet Take 50 mg by mouth daily.    . hydroxychloroquine (PLAQUENIL) 200 MG tablet Take 200 mg by mouth daily. (Patient not taking: Reported on 03/26/2020)    . mometasone-formoterol (DULERA) 100-5 MCG/ACT AERO Inhale 2 puffs into the lungs 2 (two) times daily. (Patient not taking: Reported on 03/26/2020)     Current Facility-Administered Medications on File Prior to Visit  Medication Dose Route Frequency Provider Last Rate Last Admin  . influenza vac split quadrivalent PF (FLUARIX) injection 0.5 mL  0.5 mL Intramuscular Once Anyanwu, Sallyanne Havers, MD        Allergies  Allergen Reactions  . Doxycycline Hives  . Meloxicam Swelling  .  Theophyllines Nausea And Vomiting  . Codeine Nausea And Vomiting  . Theophylline Nausea And Vomiting  . Tavist [Clemastine] Nausea And Vomiting    Social History   Socioeconomic History  . Marital status: Married    Spouse name: Not on file  . Number of children: Not on file  . Years of education: Not on file  . Highest education level: Not on file  Occupational History  . Occupation: Nurse  Tobacco Use  . Smoking status: Never Smoker  . Smokeless tobacco: Never Used  Substance and Sexual Activity  . Alcohol use: Yes    Comment: very rarely  . Drug use: No  . Sexual activity: Yes    Partners: Male    Birth control/protection: None  Other Topics Concern  . Not on file  Social History Narrative  . Not on file   Social Determinants of Health   Financial Resource Strain: Not on file  Food Insecurity: Not on file  Transportation Needs: Not on file  Physical Activity: Not on file  Stress: Not on file  Social Connections: Not on file  Intimate Partner Violence: Not on file    Family History  Problem Relation Age of Onset  . Hypertension Mother   . Hypertension Father   . Heart disease Father        pacemaker  . Cancer - Prostate Maternal Grandfather        bone and prostate   The following portions of the patient's history were reviewed and updated as appropriate: allergies, current medications, past family history, past medical history, past social history, past surgical history and problem list.  Review of Systems Pertinent items are noted in HPI.   Objective:  BP 127/85   Pulse 70   Ht 5\' 6"  (1.676 m)   Wt 203 lb (92.1 kg)   LMP 04/02/2014   BMI 32.77 kg/m  CONSTITUTIONAL: Well-developed, well-nourished female in no acute distress.  HENT:  Normocephalic, atraumatic, External right and left ear normal. Oropharynx is clear and moist EYES: Conjunctivae and EOM are normal. Pupils are equal, round, and reactive to light. No scleral icterus.  NECK: Normal  range of motion, supple, no masses.  Normal thyroid.  SKIN: Skin is warm and dry. No rash noted. Not diaphoretic. No erythema. No pallor. NEUROLOGIC: Alert and oriented to person, place, and time. Normal reflexes, muscle tone coordination. No cranial nerve deficit noted. PSYCHIATRIC: Normal mood and affect. Normal behavior. Normal judgment and thought content. CARDIOVASCULAR: Normal heart rate noted RESPIRATORY: Effort  normal, no problems with respiration noted. BREASTS: Symmetric in size. No masses, skin changes, nipple drainage, or lymphadenopathy. ABDOMEN: Soft, no distention noted.  No tenderness, rebound or guarding. Several well healed scars. PELVIC: Normal appearing external genitalia; area patient notes as sensitive appears with some increased vascularity but no other abnormal findings, normal appearing vaginal mucosa and cervix.  No abnormal discharge noted.  Pap smear obtained. Normal uterine size, no other palpable masses, no uterine or adnexal tenderness. MUSCULOSKELETAL: Normal range of motion. No tenderness.  No cyanosis, clubbing, or edema.  2+ distal pulses.  Exam done with chaperone present.   Assessment and Plan:   1. Well woman exam Healthy female exam - Ambulatory referral to Social Work  2. Cervical cancer screening - Cytology - PAP( [place])  3. Encounter for screening mammogram for malignant neoplasm of breast - MM 3D SCREEN BREAST BILATERAL; Future  4. Frequent urination - Urine Culture  5. Vulvar lesion Return for biopsy  6. Bacterial vaginosis Rx given for flagyl today   Will follow up results of pap smear and manage accordingly. Encouraged improvement in diet and exercise.  COVID vaccine UTD? Declines STI screen. Mammogram ordered Referral for colonoscopy n/a Flu vaccine  DEXA not due based on age  Routine preventative health maintenance measures emphasized. Please refer to After Visit Summary for other counseling recommendations.   Total  face-to-face time with patient: 35 minutes. Over 50% of encounter was spent on counseling and coordination of care.   Feliz Beam, MD, [place] for [employer] [facility])

## 2020-03-26 NOTE — Progress Notes (Signed)
Pt has vaginal lesion

## 2020-03-27 LAB — URINE CULTURE
MICRO NUMBER:: 11547001
Result:: NO GROWTH
SPECIMEN QUALITY:: ADEQUATE

## 2020-03-30 LAB — CYTOLOGY - PAP
Comment: NEGATIVE
Diagnosis: NEGATIVE
High risk HPV: NEGATIVE

## 2020-03-31 ENCOUNTER — Other Ambulatory Visit: Payer: Self-pay

## 2020-03-31 ENCOUNTER — Ambulatory Visit (INDEPENDENT_AMBULATORY_CARE_PROVIDER_SITE_OTHER): Payer: Managed Care, Other (non HMO) | Admitting: Licensed Clinical Social Worker

## 2020-03-31 DIAGNOSIS — Z87898 Personal history of other specified conditions: Secondary | ICD-10-CM
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
## 2020-04-09 ENCOUNTER — Ambulatory Visit: Payer: Managed Care, Other (non HMO) | Admitting: Obstetrics and Gynecology

## 2021-06-30 IMAGING — MG MM DIGITAL SCREENING BILAT W/ TOMO AND CAD
8 series · 8 of 24 positions shown · non-contrast
Comparison: Previous exam(s).

CLINICAL DATA: Screening. p

EXAM:
DIGITAL SCREENING BILATERAL MAMMOGRAM WITH TOMOSYNTHESIS AND CAD
TECHNIQUE: Bilateral screening digital craniocaudal and mediolateral oblique
mammograms were obtained. Bilateral screening digital breast
tomosynthesis was performed. The images were evaluated with
computer-aided detection.

[L MLO synth-2D]
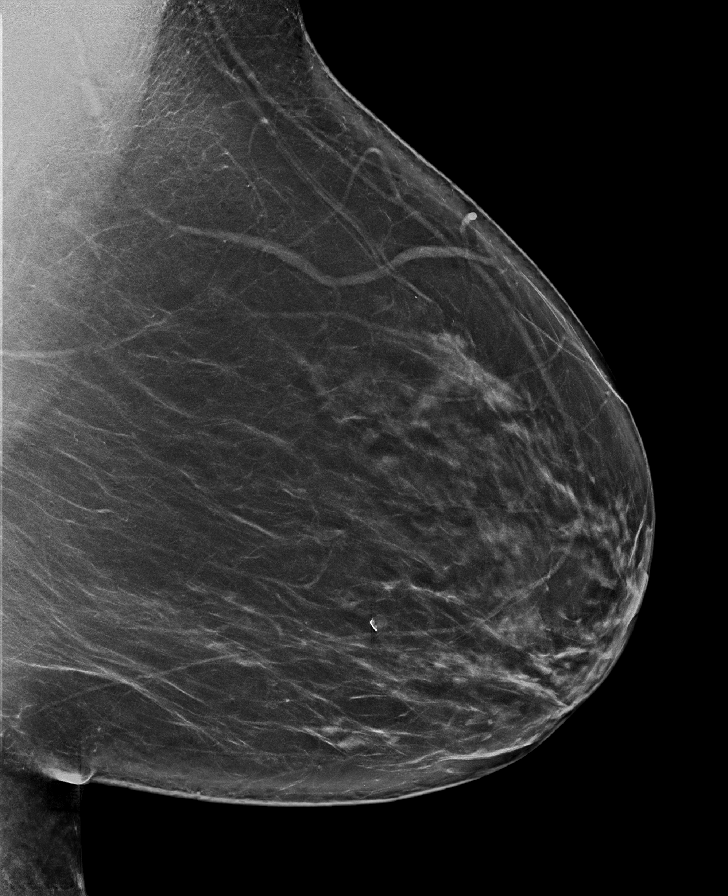

[R MLO synth-2D]
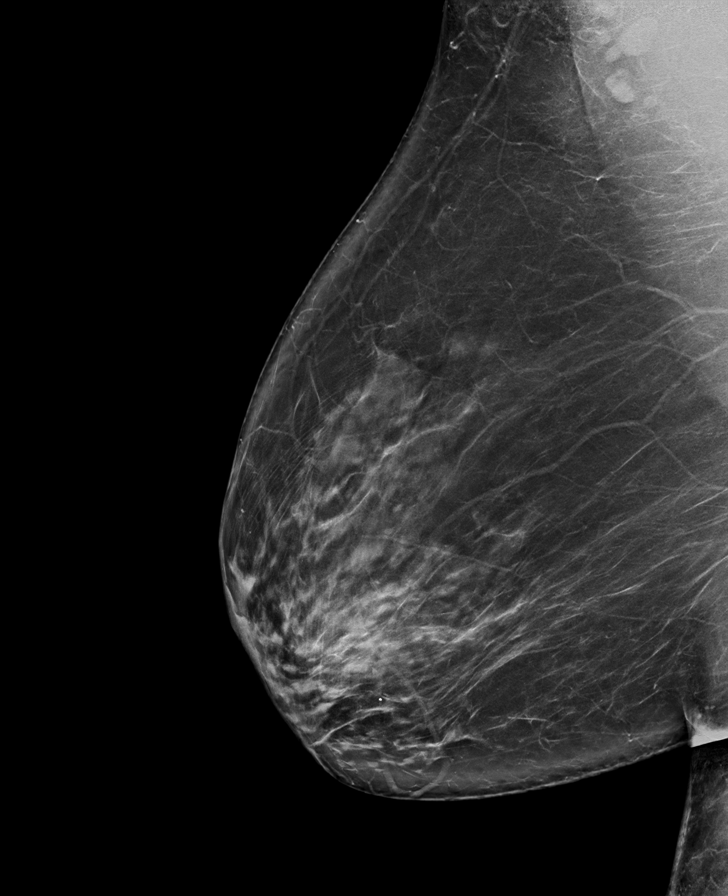

[L CC synth-2D]
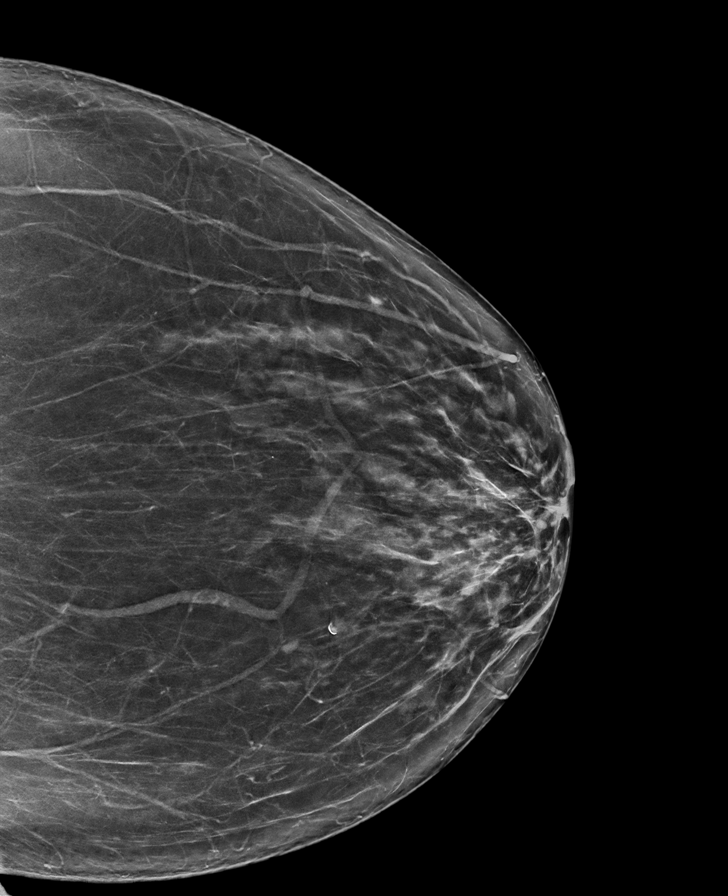

[R CC synth-2D]
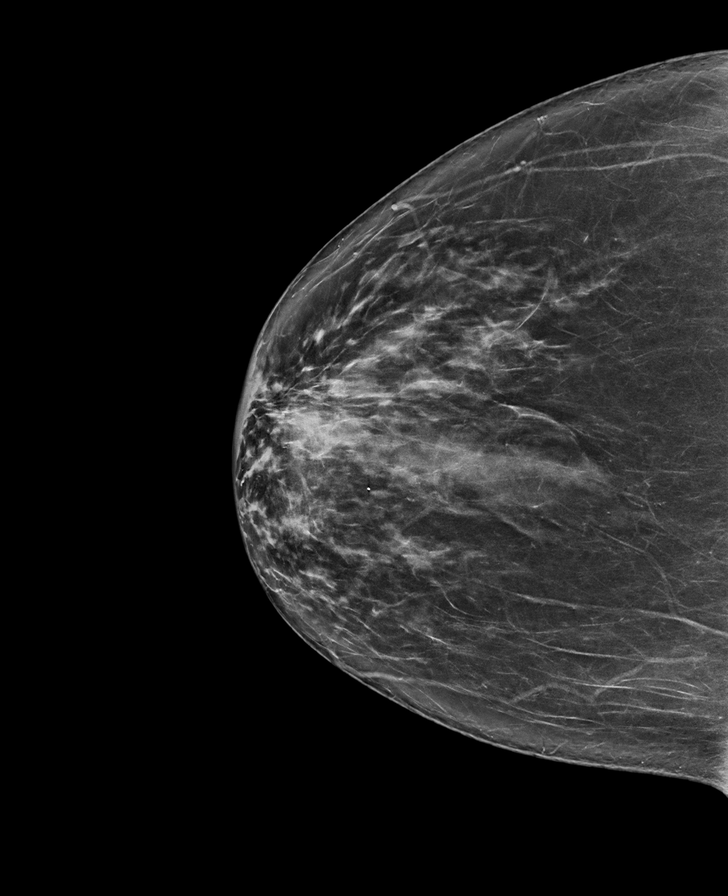

[R MLO tomo · tomo slice 46/91.0]
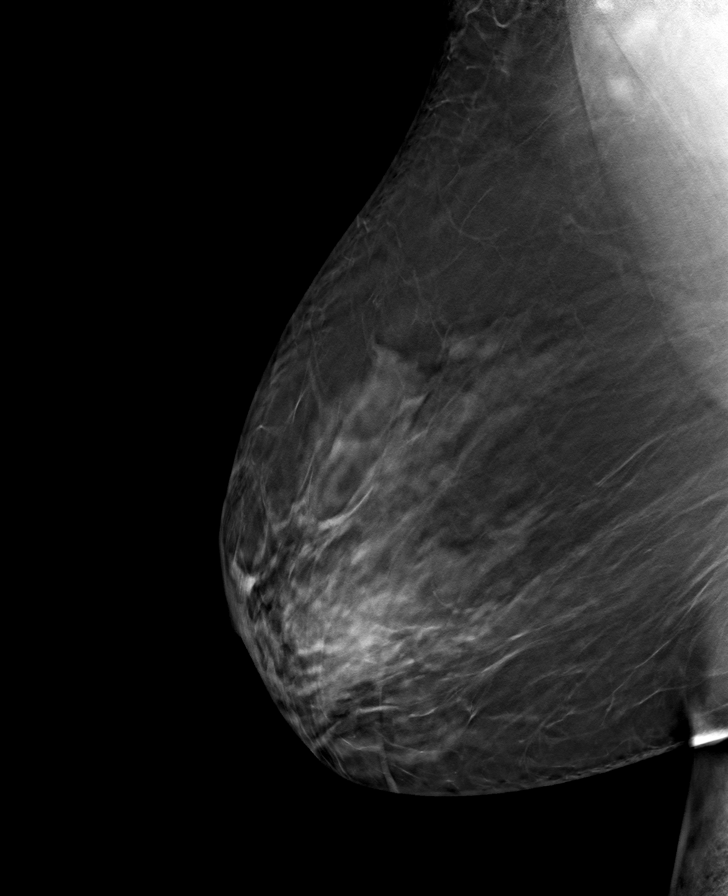

[R CC tomo · tomo slice 39/77.0]
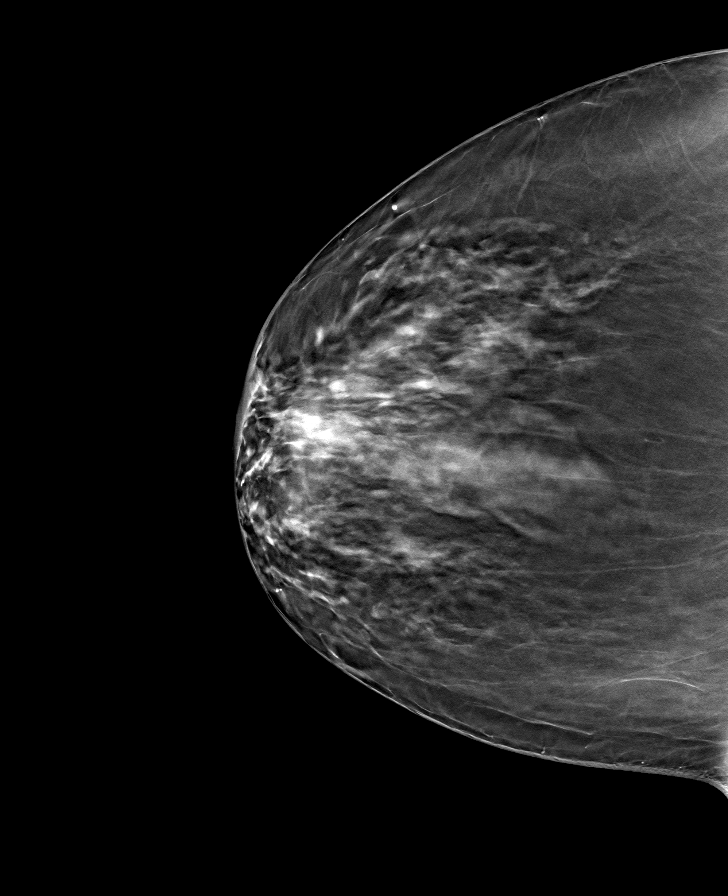

[L MLO tomo · tomo slice 47/94.0]
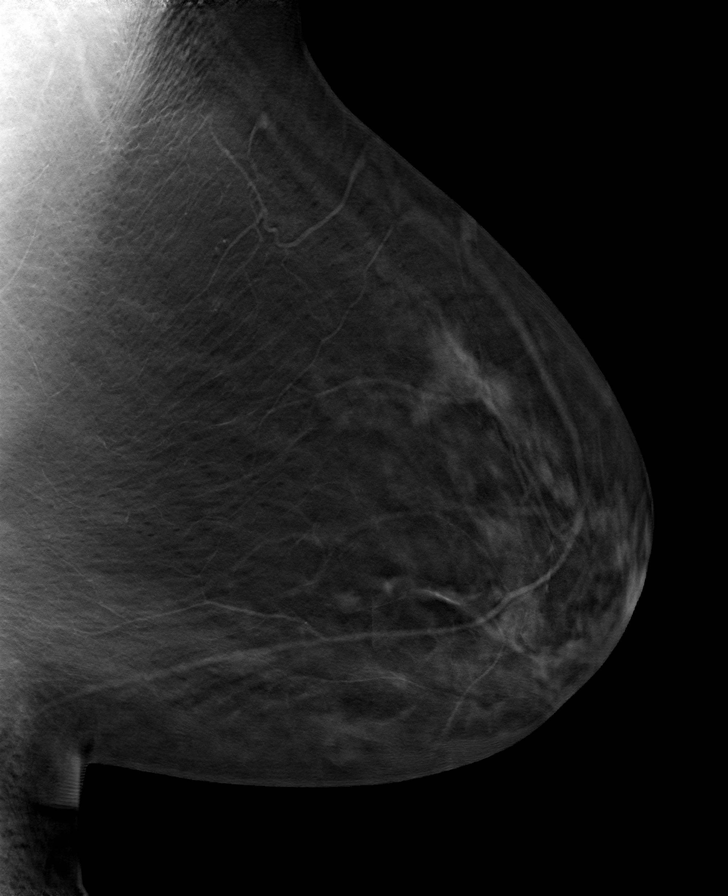

[L CC tomo · tomo slice 41/82.0]
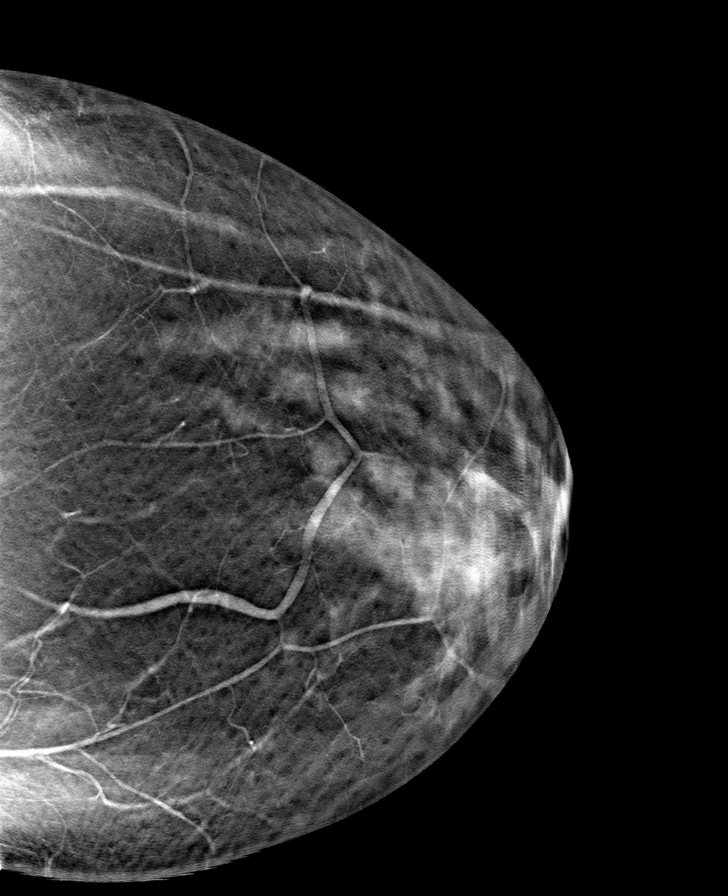

[8 of 24 positions shown; findings below may reference images not displayed]

ACR Breast Density Category c: The breast tissue is heterogeneously
dense, which may obscure small masses.
FINDINGS: There are no findings suspicious for malignancy.
IMPRESSION: No mammographic evidence of malignancy. A result letter of this
screening mammogram will be mailed directly to the patient.

RECOMMENDATION:
Screening mammogram in one year. (Code:[id number])

BI-RADS CATEGORY  1: Negative.
